# Patient Record
Sex: Male | Born: 1960 | Race: White | Hispanic: No | Marital: Married | Smoking: Former smoker
Health system: Southern US, Community
[De-identification: ages and names within clinical notes are randomized; demographics above are authoritative.]

## PROBLEM LIST (undated history)

## (undated) DIAGNOSIS — R569 Unspecified convulsions: Secondary | ICD-10-CM

## (undated) DIAGNOSIS — I4891 Unspecified atrial fibrillation: Secondary | ICD-10-CM

---

## 2006-12-11 ENCOUNTER — Ambulatory Visit: Payer: Self-pay | Admitting: Unknown Physician Specialty

## 2009-05-23 ENCOUNTER — Ambulatory Visit: Payer: Self-pay | Admitting: Family Medicine

## 2009-07-06 ENCOUNTER — Ambulatory Visit: Payer: Self-pay | Admitting: Specialist

## 2010-05-15 ENCOUNTER — Ambulatory Visit: Payer: Self-pay | Admitting: Specialist

## 2012-08-14 ENCOUNTER — Ambulatory Visit: Payer: Self-pay | Admitting: Neurology

## 2012-08-22 ENCOUNTER — Ambulatory Visit: Payer: Self-pay | Admitting: Neurology

## 2014-02-13 ENCOUNTER — Emergency Department: Payer: Self-pay | Admitting: Emergency Medicine

## 2015-12-30 ENCOUNTER — Encounter: Payer: Self-pay | Admitting: Emergency Medicine

## 2015-12-30 ENCOUNTER — Emergency Department
Admission: EM | Admit: 2015-12-30 | Discharge: 2015-12-30 | Disposition: A | Discharge status: Home / Self Care | Payer: Worker's Compensation | Attending: Emergency Medicine | Admitting: Emergency Medicine

## 2015-12-30 ENCOUNTER — Emergency Department: Payer: Worker's Compensation

## 2015-12-30 DIAGNOSIS — Z87891 Personal history of nicotine dependence: Secondary | ICD-10-CM | POA: Diagnosis not present

## 2015-12-30 DIAGNOSIS — S5002XA Contusion of left elbow, initial encounter: Secondary | ICD-10-CM | POA: Diagnosis not present

## 2015-12-30 DIAGNOSIS — S060X0A Concussion without loss of consciousness, initial encounter: Secondary | ICD-10-CM | POA: Insufficient documentation

## 2015-12-30 DIAGNOSIS — Y990 Civilian activity done for income or pay: Secondary | ICD-10-CM | POA: Insufficient documentation

## 2015-12-30 DIAGNOSIS — Y939 Activity, unspecified: Secondary | ICD-10-CM | POA: Diagnosis not present

## 2015-12-30 DIAGNOSIS — W19XXXA Unspecified fall, initial encounter: Secondary | ICD-10-CM

## 2015-12-30 DIAGNOSIS — Y929 Unspecified place or not applicable: Secondary | ICD-10-CM | POA: Diagnosis not present

## 2015-12-30 DIAGNOSIS — S8002XA Contusion of left knee, initial encounter: Secondary | ICD-10-CM | POA: Diagnosis not present

## 2015-12-30 DIAGNOSIS — S0083XA Contusion of other part of head, initial encounter: Secondary | ICD-10-CM | POA: Insufficient documentation

## 2015-12-30 DIAGNOSIS — W01198A Fall on same level from slipping, tripping and stumbling with subsequent striking against other object, initial encounter: Secondary | ICD-10-CM | POA: Insufficient documentation

## 2015-12-30 DIAGNOSIS — S0093XA Contusion of unspecified part of head, initial encounter: Secondary | ICD-10-CM

## 2015-12-30 DIAGNOSIS — S0990XA Unspecified injury of head, initial encounter: Secondary | ICD-10-CM | POA: Diagnosis present

## 2015-12-30 HISTORY — DX: Unspecified convulsions: R56.9

## 2015-12-30 MED ORDER — HYDROCODONE-ACETAMINOPHEN 5-325 MG PO TABS: 1.0000 | ORAL_TABLET | Freq: Four times a day (QID) | ORAL | 0 refills | Status: DC | PRN

## 2015-12-30 MED ORDER — IBUPROFEN 800 MG PO TABS
800.0000 mg | ORAL_TABLET | Freq: Once | ORAL | Status: AC
  Administered 2015-12-30: 800 mg via ORAL
  Filled 2015-12-30: qty 1

## 2015-12-30 MED ORDER — HYDROCODONE-ACETAMINOPHEN 5-325 MG PO TABS
1.0000 | ORAL_TABLET | ORAL | Status: AC
  Administered 2015-12-30: 1 via ORAL
  Filled 2015-12-30: qty 1

## 2015-12-30 NOTE — ED Provider Notes (Signed)
[facility] Provider Note   CSN: [id number] Arrival date & time: 12/30/15  2049     History   Chief Complaint Chief Complaint  Patient presents with  . Fall    HPI Edward SCALIA Sr. is a 55 y.o. male presents emergent department for evaluation of fall. Patient fell at work, slipped on water hit his head, left jaw, left arm along the elbow and left knee along the proximal fibula. Patient has been ambulatory. He was dazed after the fall. He has had a headache. No loss of consciousness nausea or vomiting. He denies any neck pain and lower back pain chest pain or shortness of breath. Pain is 7 out of 10 and mostly along the left parietal region of the head. He has not had any medications for pain. Wife states he is alert and oriented and with no signs of confusion. Patient denies any vision changes numbness tingling or radicular symptoms in the upper or lower extremities  HPI  Past Medical History:  Diagnosis Date  . Seizures ([place])    taken off meds in 2002    There are no active problems to display for this patient.   History reviewed. No pertinent surgical history.     Home Medications    Prior to Admission medications   Medication Sig Start Date End Date Taking? Authorizing Provider  HYDROcodone-acetaminophen (NORCO) 5-325 MG tablet Take 1 tablet by mouth every 6 (six) hours as needed for moderate pain. 12/30/15   Duanne Guess, PA-C    Family History No family history on file.  Social History Social History  Substance Use Topics  . Smoking status: Former Research scientist (life sciences)  . Smokeless tobacco: Current User  . Alcohol use No     Allergies   Review of patient's allergies indicates no known allergies.   Review of Systems Review of Systems  Constitutional: Negative.  Negative for activity change, appetite change, chills and fever.  HENT: Negative for congestion, ear pain, mouth sores, rhinorrhea, sinus pressure, sore throat and trouble swallowing.   Eyes:  Negative for photophobia, pain, discharge and visual disturbance.  Respiratory: Negative for cough, chest tightness and shortness of breath.   Cardiovascular: Negative for chest pain and leg swelling.  Gastrointestinal: Negative for abdominal distention, abdominal pain, diarrhea, nausea and vomiting.  Genitourinary: Negative for difficulty urinating and dysuria.  Musculoskeletal: Positive for arthralgias. Negative for back pain, gait problem and neck pain.  Skin: Negative for color change, rash and wound.  Neurological: Positive for headaches. Negative for dizziness, speech difficulty, weakness, light-headedness and numbness.  Hematological: Negative for adenopathy.  Psychiatric/Behavioral: Negative for agitation, behavioral problems, confusion and decreased concentration.     Physical Exam Updated Vital Signs BP (!) 144/79   Pulse 66   Temp 98.3 F (36.8 C) (Oral)   Resp 16   Ht 5\' 9"  (1.753 m)   Wt 81.6 kg   SpO2 100%   BMI 26.58 kg/m   Physical Exam  Constitutional: He is oriented to person, place, and time. He appears well-developed and well-nourished.  HENT:  Head: Normocephalic and atraumatic.  Right Ear: External ear normal.  Left Ear: External ear normal.  Nose: Nose normal.  Mouth/Throat: Oropharynx is clear and moist.  Atraumatic no hematomas or skin breakdown noted.  No battle signs.   Eyes: Conjunctivae and EOM are normal. Pupils are equal, round, and reactive to light.  Neck: Normal range of motion. Neck supple.  Cardiovascular: Normal rate and regular rhythm.   No murmur  heard. Pulmonary/Chest: Effort normal and breath sounds normal. No respiratory distress.  Abdominal: Soft. There is no tenderness.  Musculoskeletal: Normal range of motion.  Examination of the cervical thoracic lumbar spine shows patient has no spinous process tenderness. Patient has full range of motion of the shoulder or elbow wrist and digits. He is tender along the left elbow with painful  flexion. No swelling warmth erythema. No skin break down noted. Patient has full range of motion of the hip knee and ankle with no discomfort. He is tender along the proximal fibular head with mild swelling and ecchymosis. There is no skin breakdown noted. Knee is stable to valgus and varus stress testing. His neuro rest intact left lower extremity. He is able to ambulate with no assisted devices.  Neurological: He is alert and oriented to person, place, and time. He displays normal reflexes. No cranial nerve deficit. He exhibits normal muscle tone. Coordination normal.  Skin: Skin is warm and dry.  Psychiatric: He has a normal mood and affect.  Nursing note and vitals reviewed.    ED Treatments / Results  Labs (all labs ordered are listed, but only abnormal results are displayed) Labs Reviewed - No data to display  EKG  EKG Interpretation None       Radiology Dg Forearm Left  Result Date: 12/30/2015 CLINICAL DATA:  [place] at work.  Diffuse pain. EXAM: LEFT FOREARM - 2 VIEW COMPARISON:  None. FINDINGS: There is no evidence of fracture or other focal bone lesions. Soft tissues are unremarkable. IMPRESSION: Negative. Electronically Signed   By: Elon Alas M.D.   On: 12/30/2015 23:04   Dg Tibia/fibula Left  Result Date: 12/30/2015 CLINICAL DATA:  [place] at work, diffuse pain. EXAM: LEFT TIBIA AND FIBULA - 2 VIEW COMPARISON:  None. FINDINGS: There is no evidence of fracture or other focal bone lesions. Soft tissues are unremarkable. IMPRESSION: Negative. Electronically Signed   By: Elon Alas M.D.   On: 12/30/2015 23:05   Ct Head Wo Contrast  Result Date: 12/30/2015 CLINICAL DATA:  Status post fall.  Left-sided head pain. EXAM: CT HEAD WITHOUT CONTRAST CT MAXILLOFACIAL WITHOUT CONTRAST TECHNIQUE: Multidetector CT imaging of the head and maxillofacial structures were performed using the standard protocol without intravenous contrast. Multiplanar CT image reconstructions of the  maxillofacial structures were also generated. COMPARISON:  Brain MRI 08/22/2012 FINDINGS: CT HEAD FINDINGS There is no mass lesion, intraparenchymal hemorrhage or extra-axial collection. No evidence of acute cortical infarct. No hydrocephalus. Brain parenchyma and CSF-containing spaces are normal for age. The paranasal sinuses and mastoids are free of fluid. The orbits are normal. There is left frontoparietal soft tissue swelling without underlying skull fracture. CT MAXILLOFACIAL FINDINGS Complex facial fracture types: No LeFort, zygomaticomaxillary complex or nasoorbitoethmoidal fracture. Simple fracture types: None. Orbits: Globes appear intact. Normal intra- and extraconal fat. Paranasal sinuses and mastoids: Free of fluid. Calvarium and skull base No fracture identified. Extracranial soft tissues: Left frontoparietal soft tissue swelling. IMPRESSION: 1. No acute intracranial abnormality. 2. Left frontoparietal soft tissue swelling without underlying fracture. 3. No facial fracture. Electronically Signed   By: Ulyses Jarred M.D.   On: 12/30/2015 23:07   Ct Maxillofacial Wo Contrast  Result Date: 12/30/2015 CLINICAL DATA:  Status post fall.  Left-sided head pain. EXAM: CT HEAD WITHOUT CONTRAST CT MAXILLOFACIAL WITHOUT CONTRAST TECHNIQUE: Multidetector CT imaging of the head and maxillofacial structures were performed using the standard protocol without intravenous contrast. Multiplanar CT image reconstructions of the maxillofacial structures were also generated. COMPARISON:  Brain MRI 08/22/2012 FINDINGS: CT HEAD FINDINGS There is no mass lesion, intraparenchymal hemorrhage or extra-axial collection. No evidence of acute cortical infarct. No hydrocephalus. Brain parenchyma and CSF-containing spaces are normal for age. The paranasal sinuses and mastoids are free of fluid. The orbits are normal. There is left frontoparietal soft tissue swelling without underlying skull fracture. CT MAXILLOFACIAL FINDINGS  Complex facial fracture types: No LeFort, zygomaticomaxillary complex or nasoorbitoethmoidal fracture. Simple fracture types: None. Orbits: Globes appear intact. Normal intra- and extraconal fat. Paranasal sinuses and mastoids: Free of fluid. Calvarium and skull base No fracture identified. Extracranial soft tissues: Left frontoparietal soft tissue swelling. IMPRESSION: 1. No acute intracranial abnormality. 2. Left frontoparietal soft tissue swelling without underlying fracture. 3. No facial fracture. Electronically Signed   By: Ulyses Jarred M.D.   On: 12/30/2015 23:07    Procedures Procedures (including critical care time)  Medications Ordered in ED Medications  HYDROcodone-acetaminophen (NORCO/VICODIN) 5-325 MG per tablet 1 tablet (not administered)  ibuprofen (ADVIL,MOTRIN) tablet 800 mg (not administered)     Initial Impression / Assessment and Plan / ED Course  I have reviewed the triage vital signs and the nursing notes.  Pertinent labs & imaging results that were available during my care of the patient were reviewed by me and considered in my medical decision making (see chart for details).  Clinical Course    55 year old male with fall at work. Patient with mild concussion. No loss of consciousness. No nausea or vomiting. CT head and maxillofacial negative. X-rays of the left forearm and left lower leg are normal with no evidence of acute bony abnormality. He is given a prescription for Norco 5-3 25 for pain. He'll avoid driving for 24 hours. He is educated on signs and symptoms return to the ED for. He'll be monitored by his wife. He'll follow-up with PCP in 2-3 days for recheck.  Final Clinical Impressions(s) / ED Diagnoses   Final diagnoses:  Fall, initial encounter  Facial contusion, initial encounter  Head contusion, initial encounter  Knee contusion, left, initial encounter  Elbow contusion, left, initial encounter  Concussion, without loss of consciousness, initial  encounter    New Prescriptions New Prescriptions   HYDROCODONE-ACETAMINOPHEN (NORCO) 5-325 MG TABLET    Take 1 tablet by mouth every 6 (six) hours as needed for moderate pain.     Duanne Guess, PA-C 12/30/15 [id number]    Harvest Dark, MD 01/02/16 [id number]

## 2015-12-30 NOTE — Discharge Instructions (Signed)
Please ice extremities. Take medication as needed for pain. Avoid driving for 24 hours. Avoid watching TV, looking at phones, tablets until headache has improved. Please follow-up with PCP in 2-3 days for recheck. Return to the ER for any worsening headache, vomiting, or for any urgent changes in her health.

## 2015-12-30 NOTE — ED Notes (Signed)
Pt with pain to left elbow laterally, left lateral knee, left lateral forehead above eyebrow and to temporal area. perrl 74mm and brisk. Cms intact in all extremities. Pt denies loc, vomiting. Pt with redness and swelling noted to left temporal area. Pa gaines and triplett notified of possible need for head ct and to evaluate pt. Pt without drainage from ears or nose.

## 2015-12-30 NOTE — ED Notes (Signed)
I introduced myself to the pt and informed him based on his job profile we [facility]) would be filing a WC claim on him. Pt agree 'd to the UDS and verbalized that he was unable to urinate at this time. Therefore I have halted the paper work process and will initiate it when he is able to urinate since he is unable to drink due to the various scans/tests.

## 2015-12-30 NOTE — ED Notes (Signed)
Patient transported to CT 

## 2015-12-30 NOTE — ED Triage Notes (Addendum)
Slipped in some water and fell, hit head on concrete floor.  Denies LOC.  C/O pain to left head, left knee and left forearm and wrist pain.  Moving all extremities equally and strong.  Skin warm and dry. NAD  Patient was a work at [employer].  WC.

## 2015-12-30 NOTE — ED Notes (Signed)
[employer] not listed on WC list.

## 2016-01-24 ENCOUNTER — Encounter
Admission: RE | Disposition: A | Discharge status: Home / Self Care | Payer: Self-pay | Source: Ambulatory Visit | Attending: Gastroenterology

## 2016-01-24 ENCOUNTER — Ambulatory Visit
Admission: RE | Admit: 2016-01-24 | Discharge: 2016-01-24 | Disposition: A | Discharge status: Home / Self Care | Payer: Managed Care, Other (non HMO) | Source: Ambulatory Visit | Attending: Gastroenterology | Admitting: Gastroenterology

## 2016-01-24 ENCOUNTER — Ambulatory Visit: Payer: Managed Care, Other (non HMO) | Admitting: Anesthesiology

## 2016-01-24 DIAGNOSIS — Z1211 Encounter for screening for malignant neoplasm of colon: Secondary | ICD-10-CM | POA: Diagnosis present

## 2016-01-24 DIAGNOSIS — D124 Benign neoplasm of descending colon: Secondary | ICD-10-CM | POA: Diagnosis not present

## 2016-01-24 DIAGNOSIS — K5730 Diverticulosis of large intestine without perforation or abscess without bleeding: Secondary | ICD-10-CM | POA: Insufficient documentation

## 2016-01-24 DIAGNOSIS — Z8669 Personal history of other diseases of the nervous system and sense organs: Secondary | ICD-10-CM | POA: Diagnosis not present

## 2016-01-24 DIAGNOSIS — D1779 Benign lipomatous neoplasm of other sites: Secondary | ICD-10-CM | POA: Diagnosis not present

## 2016-01-24 DIAGNOSIS — Z87891 Personal history of nicotine dependence: Secondary | ICD-10-CM | POA: Diagnosis not present

## 2016-01-24 HISTORY — PX: COLONOSCOPY WITH PROPOFOL: SHX5780

## 2016-01-24 SURGERY — COLONOSCOPY WITH PROPOFOL
Anesthesia: General

## 2016-01-24 MED ORDER — SODIUM CHLORIDE 0.9 % IV SOLN
INTRAVENOUS | Status: DC
  Administered 2016-01-24 (×2): via INTRAVENOUS

## 2016-01-24 MED ORDER — PROPOFOL 500 MG/50ML IV EMUL
INTRAVENOUS | Status: DC | PRN
  Administered 2016-01-24: 100 ug/kg/min via INTRAVENOUS

## 2016-01-24 MED ORDER — SODIUM CHLORIDE 0.9 % IV SOLN
INTRAVENOUS | Status: DC
  Administered 2016-01-24: 1000 mL via INTRAVENOUS

## 2016-01-24 NOTE — Anesthesia Preprocedure Evaluation (Signed)
Anesthesia Evaluation  Patient identified by MRN, date of birth, ID band Patient awake    Reviewed: Allergy & Precautions, NPO status , Patient's Chart, lab work & pertinent test results  History of Anesthesia Complications Negative for: history of anesthetic complications  Airway Mallampati: II       Dental   Pulmonary neg pulmonary ROS, former smoker,           Cardiovascular negative cardio ROS       Neuro/Psych Seizures - (none in 20 yers, no meds x 15 yrs),     GI/Hepatic negative GI ROS, Neg liver ROS,   Endo/Other  negative endocrine ROS  Renal/GU negative Renal ROS     Musculoskeletal   Abdominal   Peds  Hematology negative hematology ROS (+)   Anesthesia Other Findings   Reproductive/Obstetrics                             Anesthesia Physical Anesthesia Plan  ASA: II  Anesthesia Plan: General   Post-op Pain Management:    Induction: Intravenous  Airway Management Planned: Nasal Cannula  Additional Equipment:   Intra-op Plan:   Post-operative Plan:   Informed Consent: I have reviewed the patients History and Physical, chart, labs and discussed the procedure including the risks, benefits and alternatives for the proposed anesthesia with the patient or authorized representative who has indicated his/her understanding and acceptance.     Plan Discussed with:   Anesthesia Plan Comments:         Anesthesia Quick Evaluation

## 2016-01-24 NOTE — Transfer of Care (Signed)
Immediate Anesthesia Transfer of Care Note  Patient: Edward Lion Sr.  Procedure(s) Performed: Procedure(s): COLONOSCOPY WITH PROPOFOL (N/A)  Patient Location: PACU  Anesthesia Type:General  Level of Consciousness: awake, alert  and oriented  Airway & Oxygen Therapy: Patient Spontanous Breathing and Patient connected to nasal cannula oxygen  Post-op Assessment: Report given to RN and Post -op Vital signs reviewed and stable  Post vital signs: Reviewed and stable  Last Vitals:  Vitals:   01/24/16 1320 01/24/16 1509  BP: 134/66   Pulse: 71   Resp: 20   Temp: 36.7 C (!) (P) 36 C    Last Pain:  Vitals:   01/24/16 1509  TempSrc: (P) Tympanic         Complications: No apparent anesthesia complications

## 2016-01-24 NOTE — Op Note (Addendum)
[facility] Gastroenterology Patient Name: Edward Juarez Procedure Date: 01/24/2016 2:09 PM MRN: [id number] Account #: [id number] Date of Birth: [birth year] Admit Type: Outpatient Age: 55 Room: [facility] ENDO ROOM 3 Gender: Male Note Status: Finalized Procedure:            Colonoscopy Indications:          Screening for colorectal malignant neoplasm, This is                        the patient's first colonoscopy Providers:            Lollie Sails, MD Referring MD:         Precious Bard, MD (Referring MD) Medicines:            Monitored Anesthesia Care Complications:        No immediate complications. Procedure:            Pre-Anesthesia Assessment:                       - ASA Grade Assessment: II - A patient with mild                        systemic disease.                       After obtaining informed consent, the colonoscope was                        passed under direct vision. Throughout the procedure,                        the patient's blood pressure, pulse, and oxygen                        saturations were monitored continuously. The                        Colonoscope was introduced through the anus and                        advanced to the the cecum, identified by appendiceal                        orifice and ileocecal valve. The colonoscopy was                        performed without difficulty. The patient tolerated the                        procedure well. The quality of the bowel preparation                        was good. Findings:      Multiple small-mouthed diverticula were found in the sigmoid colon and       descending colon.      A 6 mm polyp was found in the distal descending colon. The polyp was       flat. The polyp was removed with a cold snare. Resection and retrieval       were complete.      A 14 mm polyp was found in the  proximal descending colon. The polyp was       flat. The polyp was removed with a cold biopsy forceps  and The polyp was       removed with a lift and cut technique using a hot snare. Resection and       retrieval were complete using a suction (via the working channel). Three       hemostatic clips were successfully placed (MR conditional).      The exam was otherwise normal throughout the examined colon.      The retroflexed view of the distal rectum and anal verge was normal and       showed no anal or rectal abnormalities.      The digital rectal exam was normal.      There was a small lipoma, 12 mm in diameter, in the mid ascending colon,       note positive pillow sign. Impression:           - Diverticulosis in the sigmoid colon and in the                        descending colon.                       - One 6 mm polyp in the distal descending colon,                        removed with a cold snare. Resected and retrieved.                       - One 14 mm polyp in the proximal descending colon,                        removed with a cold biopsy forceps and removed using                        lift and cut and a hot snare. Resected and retrieved.                        Clips (MR conditional) were placed.                       - The distal rectum and anal verge are normal on                        retroflexion view. Recommendation:       - Discharge patient to home.                       - Telephone GI clinic for pathology results in 1 week. Procedure Code(s):    --- Professional ---                       [phone], Colonoscopy, flexible; with removal of tumor(s),                        polyp(s), or other lesion(s) by snare technique Diagnosis Code(s):    --- Professional ---                       Z12.11, Encounter for screening for malignant neoplasm  of colon                       D12.4, Benign neoplasm of descending colon                       K57.30, Diverticulosis of large intestine without                        perforation or abscess without bleeding CPT  copyright 2016 American Medical Association. All rights reserved. The codes documented in this report are preliminary and upon coder review may  be revised to meet current compliance requirements. Lollie Sails, MD 01/24/2016 3:11:17 PM This report has been signed electronically. Number of Addenda: 0 Note Initiated On: 01/24/2016 2:09 PM Scope Withdrawal Time: 0 hours 30 minutes 10 seconds  Total Procedure Duration: 0 hours 41 minutes 37 seconds       [facility]

## 2016-01-24 NOTE — OR Nursing (Signed)
Three clips applied to hot snared proximal decending colon polyp

## 2016-01-24 NOTE — H&P (Signed)
Outpatient short stay form Pre-procedure 01/24/2016 2:13 PM Lollie Sails MD  Primary Physician: Jackelyn Poling PA  Reason for visit:  Screening colonoscopy  History of present illness:  Patient is a 55 year old male presenting today as above. He tolerated his prep well. He takes no aspirin products or blood thinning agents. This is his first colonoscopy.    Current Facility-Administered Medications:  .  0.9 %  sodium chloride infusion, , Intravenous, Continuous, Lollie Sails, MD, Last Rate: 20 mL/hr at 01/24/16 1320 .  0.9 %  sodium chloride infusion, , Intravenous, Continuous, Lollie Sails, MD  Prescriptions Prior to Admission  Medication Sig Dispense Refill Last Dose  . HYDROcodone-acetaminophen (NORCO) 5-325 MG tablet Take 1 tablet by mouth every 6 (six) hours as needed for moderate pain. (Patient not taking: Reported on 01/23/2016) 15 tablet 0 Completed Course at Unknown time     No Known Allergies   Past Medical History:  Diagnosis Date  . Seizures ([place])    taken off meds in 2002    Review of systems:      Physical Exam    Heart and lungs: Regular rate and rhythm without rub or gallop, lungs are bilaterally clear.    HEENT: Normocephalic atraumatic eyes are anicteric    Other:     Pertinant exam for procedure: Soft nontender nondistended bowel sounds positive normoactive.    Planned proceedures: Colonoscopy and indicated procedures. I have discussed the risks benefits and complications of procedures to include not limited to bleeding, infection, perforation and the risk of sedation and the patient wishes to proceed.    Lollie Sails, MD Gastroenterology 01/24/2016  2:13 PM

## 2016-01-25 ENCOUNTER — Encounter: Payer: Self-pay | Admitting: Gastroenterology

## 2016-01-25 NOTE — Anesthesia Postprocedure Evaluation (Signed)
Anesthesia Post Note  Patient: DARRELL EMRICH Sr.  Procedure(s) Performed: Procedure(s) (LRB): COLONOSCOPY WITH PROPOFOL (N/A)  Patient location during evaluation: Endoscopy Anesthesia Type: General Level of consciousness: awake and alert Pain management: pain level controlled Vital Signs Assessment: post-procedure vital signs reviewed and stable Respiratory status: spontaneous breathing, nonlabored ventilation, respiratory function stable and patient connected to nasal cannula oxygen Cardiovascular status: blood pressure returned to baseline and stable Postop Assessment: no signs of nausea or vomiting Anesthetic complications: no    Last Vitals:  Vitals:   01/24/16 1529 01/24/16 1539  BP: 129/80 122/75  Pulse: 68 (!) 58  Resp: 19 14  Temp:      Last Pain:  Vitals:   01/24/16 1509  TempSrc: Tympanic                 Martha Clan

## 2016-01-26 LAB — SURGICAL PATHOLOGY

## 2017-04-29 DIAGNOSIS — Z719 Counseling, unspecified: Secondary | ICD-10-CM | POA: Diagnosis not present

## 2017-05-23 DIAGNOSIS — J039 Acute tonsillitis, unspecified: Secondary | ICD-10-CM | POA: Diagnosis not present

## 2017-08-22 IMAGING — CT CT HEAD W/O CM
3 of 6 series · 15 of 47 positions shown, 18 images · non-contrast
Comparison: Brain MRI 08/22/2012

CLINICAL DATA: Status post fall.  Left-sided head pain.

EXAM:
CT HEAD WITHOUT CONTRAST
CT MAXILLOFACIAL WITHOUT CONTRAST
TECHNIQUE: Multidetector CT imaging of the head and maxillofacial structures
were performed using the standard protocol without intravenous
contrast. Multiplanar CT image reconstructions of the maxillofacial
structures were also generated.

[Series 4: coronal soft tissue · coronal · 0.30mm/px · 3 of 66 slices shown]
[im 17/66  brain]
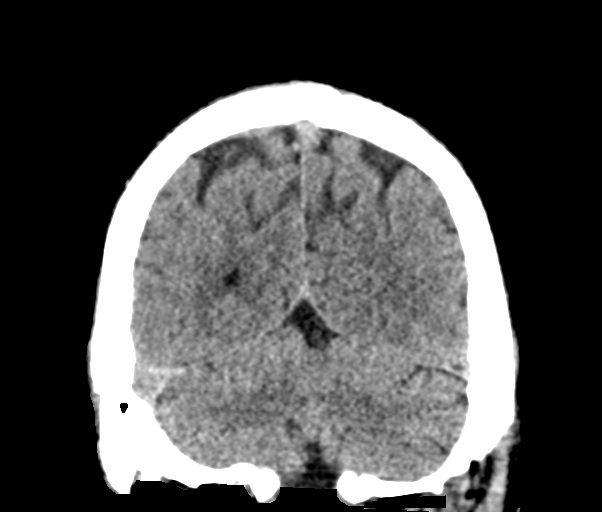
[im 33/66  brain]
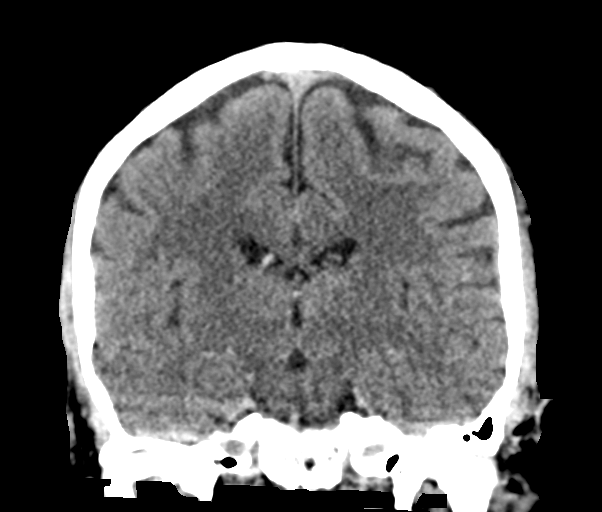
[im 49/66  brain]
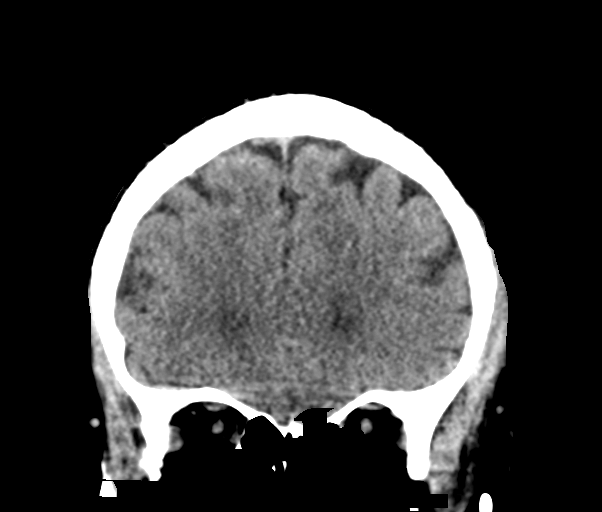

[Series 6: max soft · axial · 0.37mm/px · z∈[-186,-26]mm · 10 of 98 slices shown, 13 images]
[im 9/98  brain]
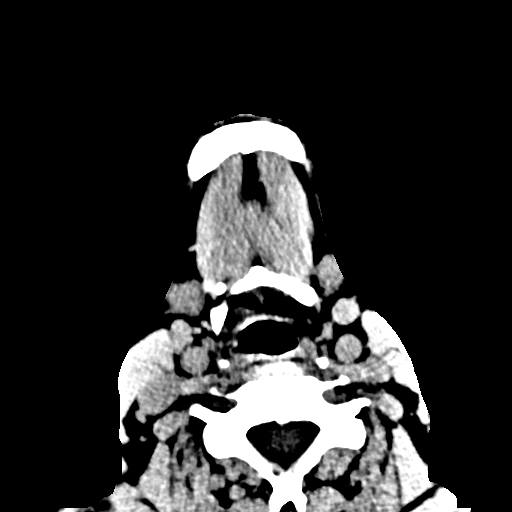
[im 9/98  bone]
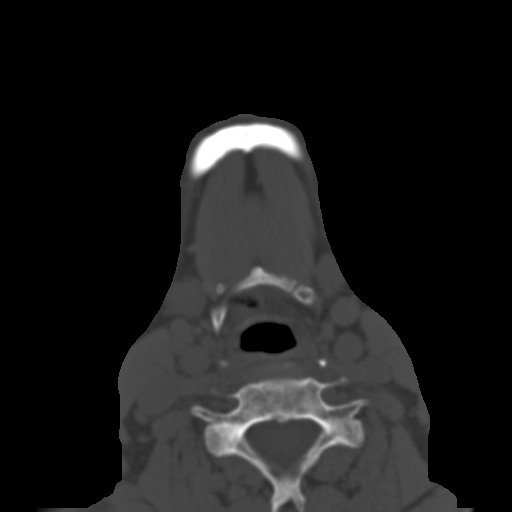
[im 18/98  brain]
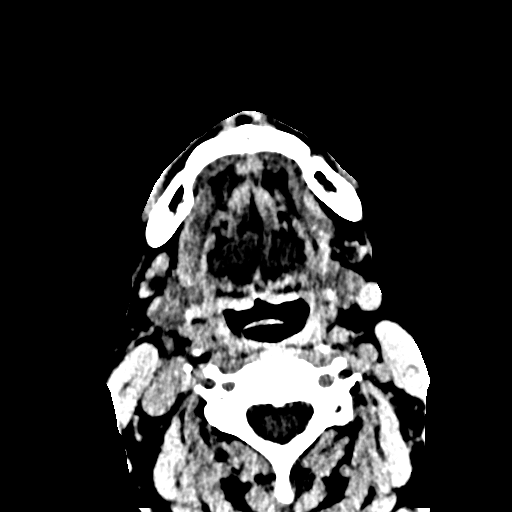
[im 27/98  brain]
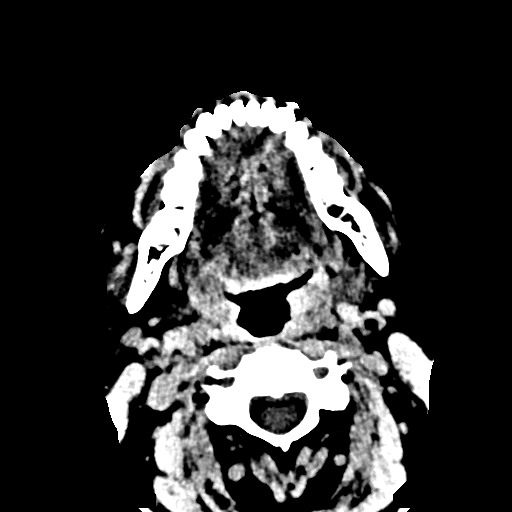
[im 36/98  brain]
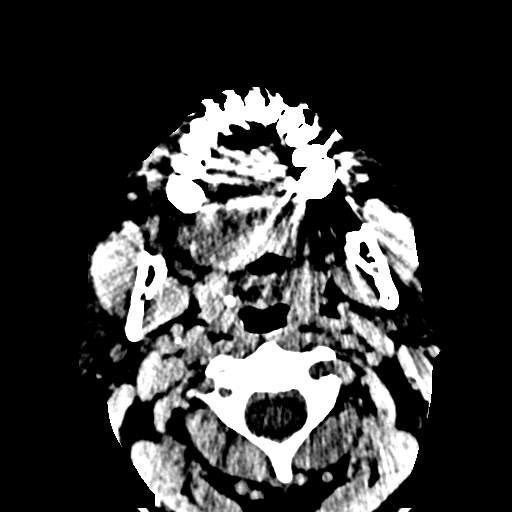
[im 45/98  brain]
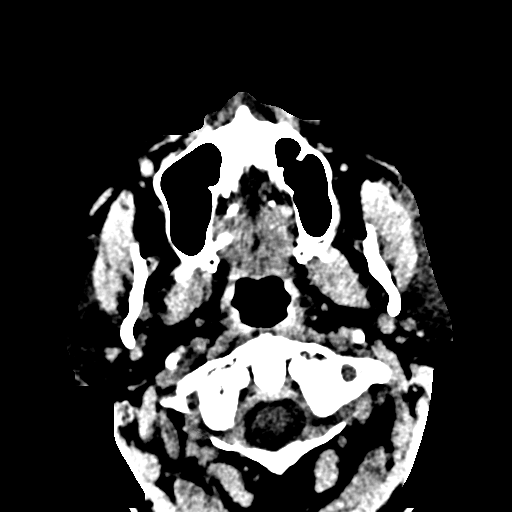
[im 45/98  bone]
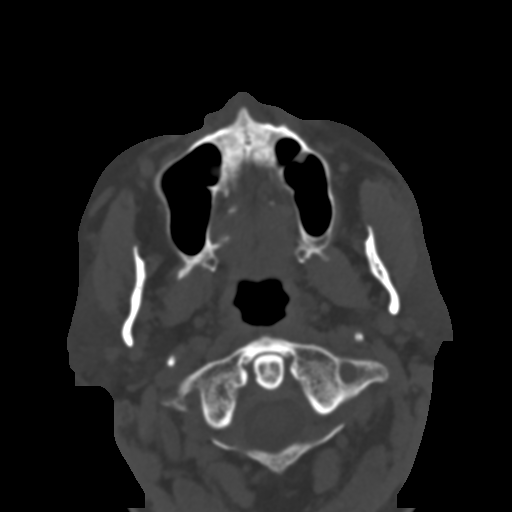
[im 53/98  brain]
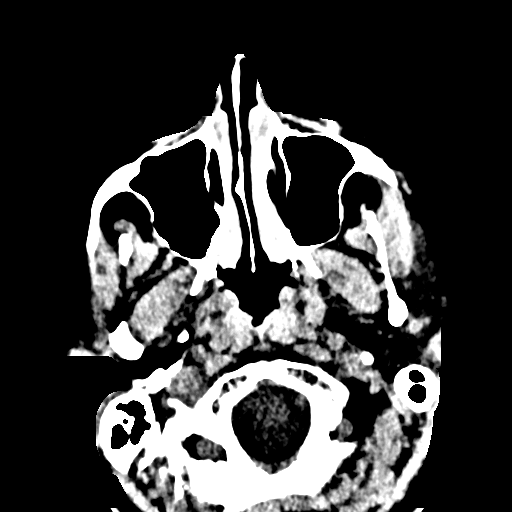
[im 62/98  brain]
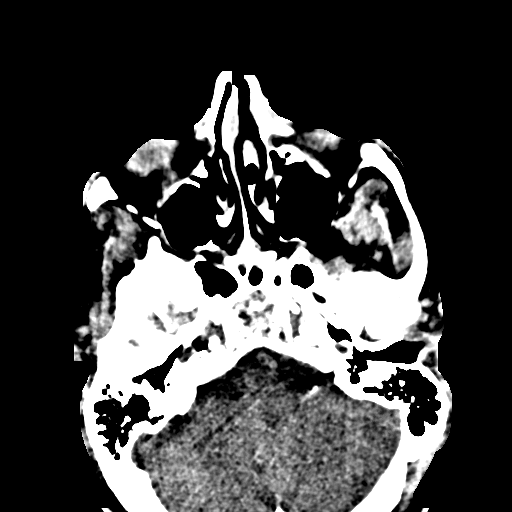
[im 71/98  brain]
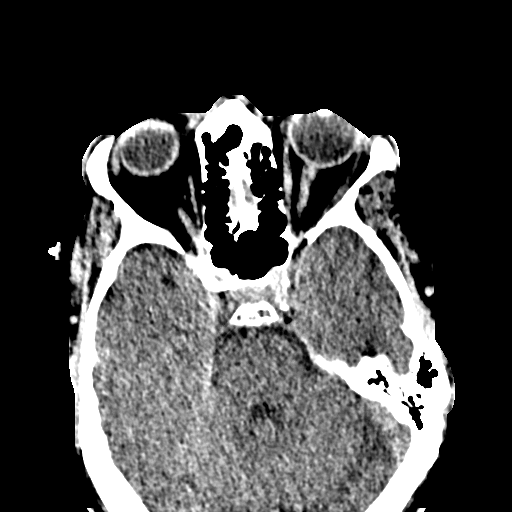
[im 80/98  brain]
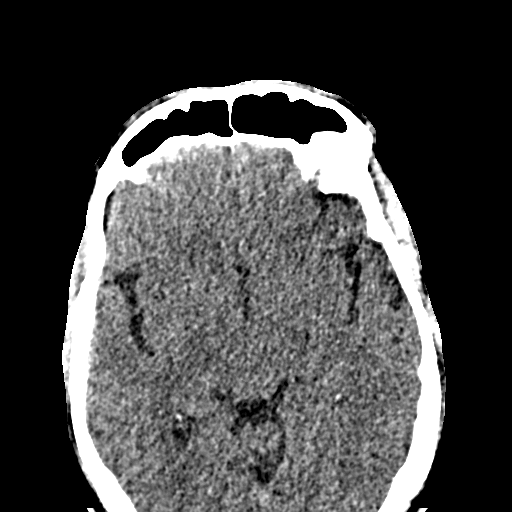
[im 80/98  bone]
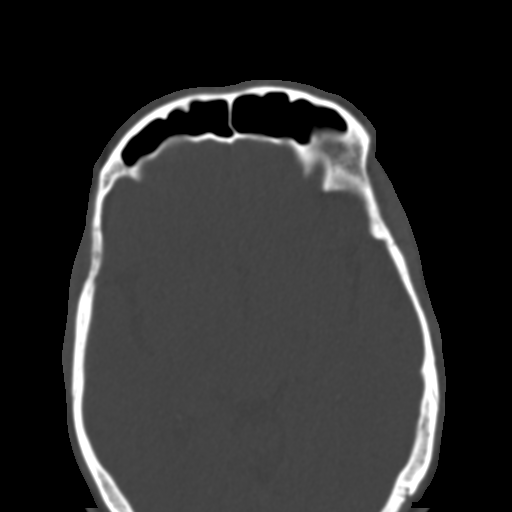
[im 89/98  brain]
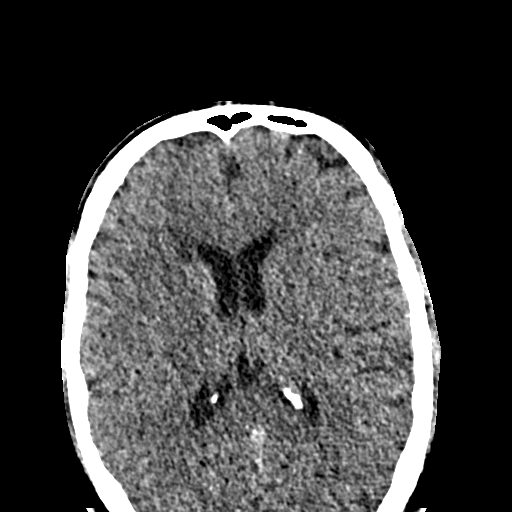

[Series 11: sagittal soft · sagittal · 0.38mm/px · 2 of 77 slices shown]
[im 26/77  brain]
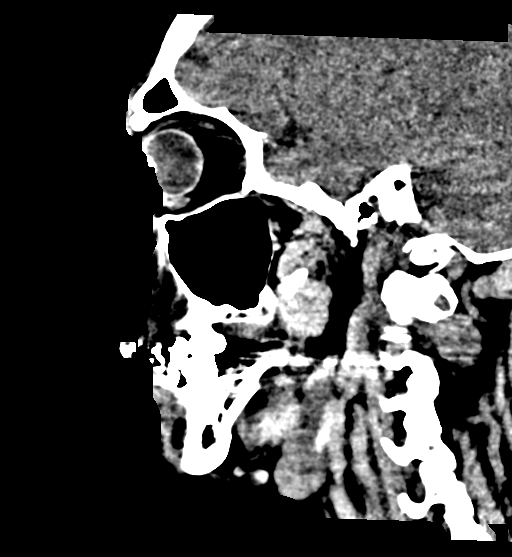
[im 51/77  brain]
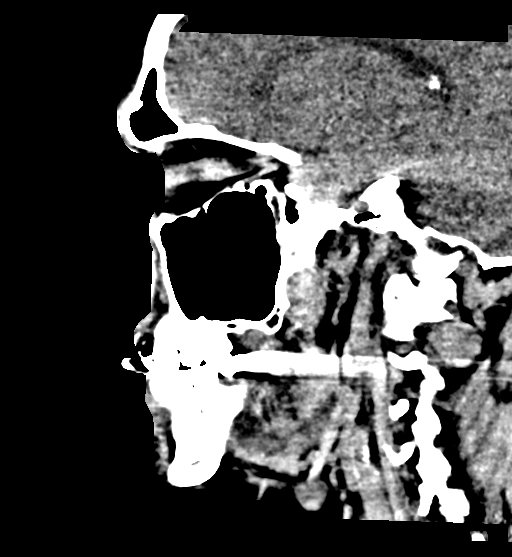

[15 of 47 positions shown; findings below may reference images not displayed]

FINDINGS: CT HEAD FINDINGS

There is no mass lesion, intraparenchymal hemorrhage or extra-axial
collection. No evidence of acute cortical infarct. No hydrocephalus.
Brain parenchyma and CSF-containing spaces are normal for age.

The paranasal sinuses and mastoids are free of fluid. The orbits are
normal. There is left frontoparietal soft tissue swelling without
underlying skull fracture.

CT MAXILLOFACIAL FINDINGS

Complex facial fracture types: No LeFort, zygomaticomaxillary
complex or nasoorbitoethmoidal fracture.

Simple fracture types: None.

Orbits: Globes appear intact. Normal intra- and extraconal fat.

Paranasal sinuses and mastoids: Free of fluid.

Calvarium and skull base No fracture identified.

Extracranial soft tissues: Left frontoparietal soft tissue swelling.
IMPRESSION: 1. No acute intracranial abnormality.
2. Left frontoparietal soft tissue swelling without underlying
fracture.
3. No facial fracture.

## 2017-11-06 ENCOUNTER — Other Ambulatory Visit: Payer: Self-pay | Admitting: Physician Assistant

## 2017-11-06 DIAGNOSIS — R1032 Left lower quadrant pain: Secondary | ICD-10-CM

## 2017-11-15 ENCOUNTER — Ambulatory Visit
Admission: RE | Admit: 2017-11-15 | Discharge: 2017-11-15 | Disposition: A | Discharge status: Home / Self Care | Payer: Commercial Managed Care - HMO | Source: Ambulatory Visit | Attending: Physician Assistant | Admitting: Physician Assistant

## 2017-11-15 DIAGNOSIS — R319 Hematuria, unspecified: Secondary | ICD-10-CM | POA: Insufficient documentation

## 2017-11-15 DIAGNOSIS — R1032 Left lower quadrant pain: Secondary | ICD-10-CM | POA: Insufficient documentation

## 2017-11-15 DIAGNOSIS — M5137 Other intervertebral disc degeneration, lumbosacral region: Secondary | ICD-10-CM | POA: Diagnosis not present

## 2017-11-15 DIAGNOSIS — I700 Atherosclerosis of aorta: Secondary | ICD-10-CM | POA: Diagnosis not present

## 2017-11-15 DIAGNOSIS — M4317 Spondylolisthesis, lumbosacral region: Secondary | ICD-10-CM | POA: Diagnosis not present

## 2017-11-15 DIAGNOSIS — M8448XA Pathological fracture, other site, initial encounter for fracture: Secondary | ICD-10-CM | POA: Diagnosis not present

## 2020-07-22 ENCOUNTER — Emergency Department
Admission: EM | Admit: 2020-07-22 | Discharge: 2020-07-22 | Disposition: A | Discharge status: Home / Self Care | Payer: BC Managed Care – PPO | Attending: Emergency Medicine | Admitting: Emergency Medicine

## 2020-07-22 ENCOUNTER — Emergency Department: Payer: BC Managed Care – PPO

## 2020-07-22 ENCOUNTER — Other Ambulatory Visit: Payer: Self-pay

## 2020-07-22 DIAGNOSIS — R000 Tachycardia, unspecified: Secondary | ICD-10-CM | POA: Diagnosis present

## 2020-07-22 DIAGNOSIS — I4891 Unspecified atrial fibrillation: Secondary | ICD-10-CM | POA: Insufficient documentation

## 2020-07-22 DIAGNOSIS — Z87891 Personal history of nicotine dependence: Secondary | ICD-10-CM | POA: Insufficient documentation

## 2020-07-22 LAB — MAGNESIUM: Magnesium: 2.2 mg/dL (ref 1.7–2.4)

## 2020-07-22 LAB — T4, FREE: Free T4: 0.93 ng/dL (ref 0.61–1.12)

## 2020-07-22 LAB — CBC
HCT: 48.2 % (ref 39.0–52.0)
Hemoglobin: 16.1 g/dL (ref 13.0–17.0)
MCH: 30.1 pg (ref 26.0–34.0)
MCHC: 33.4 g/dL (ref 30.0–36.0)
MCV: 90.3 fL (ref 80.0–100.0)
Platelets: 266 10*3/uL (ref 150–400)
RBC: 5.34 MIL/uL (ref 4.22–5.81)
RDW: 12.6 % (ref 11.5–15.5)
WBC: 10.7 10*3/uL — ABNORMAL HIGH (ref 4.0–10.5)
nRBC: 0 % (ref 0.0–0.2)

## 2020-07-22 LAB — BASIC METABOLIC PANEL
Anion gap: 8 (ref 5–15)
BUN: 19 mg/dL (ref 6–20)
CO2: 22 mmol/L (ref 22–32)
Calcium: 9 mg/dL (ref 8.9–10.3)
Chloride: 105 mmol/L (ref 98–111)
Creatinine, Ser: 1.16 mg/dL (ref 0.61–1.24)
GFR, Estimated: >60 mL/min (ref >60)
Glucose, Bld: 161 mg/dL — ABNORMAL HIGH (ref 70–99)
Potassium: 4 mmol/L (ref 3.5–5.1)
Sodium: 135 mmol/L (ref 135–145)

## 2020-07-22 LAB — TROPONIN I (HIGH SENSITIVITY)
Troponin I (High Sensitivity): 4 ng/L (ref <18)
Troponin I (High Sensitivity): 4 ng/L (ref <18)

## 2020-07-22 LAB — TSH: TSH: 1.673 u[IU]/mL (ref 0.350–4.500)

## 2020-07-22 MED ORDER — METOPROLOL TARTRATE 25 MG PO TABS: 12.5000 mg | ORAL_TABLET | Freq: Two times a day (BID) | ORAL | 0 refills | Status: AC

## 2020-07-22 MED ORDER — METOPROLOL TARTRATE 25 MG PO TABS
12.5000 mg | ORAL_TABLET | Freq: Once | ORAL | Status: AC
  Administered 2020-07-22: 12.5 mg via ORAL
  Filled 2020-07-22: qty 1

## 2020-07-22 NOTE — ED Triage Notes (Signed)
Pt states his apple watch has been alarming tachycardia and a fib the past few days. No hx of same. States he has felt a hot flash in chest intermittently. A&O, in wheelchair, speech clear.

## 2020-07-22 NOTE — ED Notes (Signed)
Report to [facility], Therapist, sports.

## 2020-07-22 NOTE — Discharge Instructions (Addendum)
Your work-up shows new atrial fibrillation.  Call the cardiology number and see you need the ER follow-up.  We started you on metoprolol.  We will start by taking half a pill twice a day.  Take one baby aspirin daily, this is 81 mg.  Return to the ER for prolonged elevated heart rates or having significant symptoms

## 2020-07-22 NOTE — ED Notes (Signed)
Patient reports getting alerts on his smart watch that his heart rate was fast. The patient denies chest pain, but reports "hot flashes" a few times a week for 2 weeks. Patient reports several HR alerts on his phone over the last few days, with the highest rate being 169. Patient denies hx of arrhythmias, or cardiac problems.

## 2020-07-22 NOTE — ED Provider Notes (Signed)
[facility] Emergency Department Provider Note  ____________________________________________   Event Date/Time   First MD Initiated Contact with Patient 07/22/20 1743     (approximate)  I have reviewed the triage vital signs and the nursing notes.   HISTORY  Chief Complaint Tachycardia    HPI Edward MALINOSKI Sr. is a 60 y.o. male who is otherwise healthy who comes in with atrial fibrillation.  Patient reports he has an apple watch who over the past 1 week has been reading a high heart rate.  He denies any chest pain or shortness of breath.  Denies any leg swelling.  Denies history of PE.  Patient reports sometimes he feels a little hot.  His heart rates have been highest at 169, nothing makes it better, nothing makes them worse.  Denies any history of cardiac problems.            Past Medical History:  Diagnosis Date  . Seizures ([place])    taken off meds in 2002    There are no problems to display for this patient.   Past Surgical History:  Procedure Laterality Date  . COLONOSCOPY WITH PROPOFOL N/A 01/24/2016   Procedure: COLONOSCOPY WITH PROPOFOL;  Surgeon: Edward Sails, MD;  Location: [facility] ENDOSCOPY;  Service: Endoscopy;  Laterality: N/A;    Prior to Admission medications   Medication Sig Start Date End Date Taking? Authorizing Provider  HYDROcodone-acetaminophen (NORCO) 5-325 MG tablet Take 1 tablet by mouth every 6 (six) hours as needed for moderate pain. Patient not taking: Reported on 01/23/2016 12/30/15   Edward Guess, PA-C    Allergies Patient has no known allergies.  History reviewed. No pertinent family history.  Social History Social History   Tobacco Use  . Smoking status: Former Research scientist (life sciences)  . Smokeless tobacco: Current User  . Tobacco comment: vaporizer used this am  Vaping Use  . Vaping Use: Every day  Substance Use Topics  . Alcohol use: Yes    Comment: 3weeks liquor  . Drug use: No      Review of  Systems Constitutional: No fever/chills Eyes: No visual changes. ENT: No sore throat. Cardiovascular: elevated heart rate Respiratory: Denies shortness of breath. Gastrointestinal: No abdominal pain.  No nausea, no vomiting.  No diarrhea.  No constipation. Genitourinary: Negative for dysuria. Musculoskeletal: Negative for back pain. Skin: Negative for rash. Neurological: Negative for headaches, focal weakness or numbness. All other ROS negative ____________________________________________   PHYSICAL EXAM:  VITAL SIGNS: ED Triage Vitals  Enc Vitals Group     BP 07/22/20 1657 (!) 162/95     Pulse Rate 07/22/20 1657 96     Resp 07/22/20 1657 18     Temp 07/22/20 1657 98.3 F (36.8 C)     Temp Source 07/22/20 1657 Oral     SpO2 07/22/20 1657 99 %     Weight 07/22/20 1658 185 lb (83.9 kg)     Height 07/22/20 1658 5\' 9"  (1.753 m)     Head Circumference --      Peak Flow --      Pain Score 07/22/20 1658 0     Pain Loc --      Pain Edu? --      Excl. in [place]? --     Constitutional: Alert and oriented. Well appearing and in no acute distress. Eyes: Conjunctivae are normal. EOMI. Head: Atraumatic. Nose: No congestion/rhinnorhea. Mouth/Throat: Mucous membranes are moist.   Neck: No stridor. Trachea Midline. FROM Cardiovascular: Normal  rate, irregular. Grossly normal heart sounds.  Good peripheral circulation. Respiratory: Normal respiratory effort.  No retractions. Lungs CTAB. Gastrointestinal: Soft and nontender. No distention. No abdominal bruits.  Musculoskeletal: No lower extremity tenderness nor edema.  No joint effusions. Neurologic:  Normal speech and language. No gross focal neurologic deficits are appreciated.  Skin:  Skin is warm, dry and intact. No rash noted. Psychiatric: Mood and affect are normal. Speech and behavior are normal. GU: Deferred   ____________________________________________   LABS (all labs ordered are listed, but only abnormal results are  displayed)  Labs Reviewed  BASIC METABOLIC PANEL - Abnormal; Notable for the following components:      Result Value   Glucose, Bld 161 (*)    All other components within normal limits  CBC - Abnormal; Notable for the following components:   WBC 10.7 (*)    All other components within normal limits  MAGNESIUM  TSH  T4, FREE  TROPONIN I (HIGH SENSITIVITY)  TROPONIN I (HIGH SENSITIVITY)   ____________________________________________   ED ECG REPORT I, Edward Juarez, the attending physician, personally viewed and interpreted this ECG.  Atrial fibrillation rate of 133, no ST elevation, no T wave inversions except for aVL, normal intervals ____________________________________________  RADIOLOGY Edward Juarez, personally viewed and evaluated these images (plain radiographs) as part of my medical decision making, as well as reviewing the written report by the radiologist.  ED MD interpretation: No effusions  Official radiology report(s): DG Chest 2 View  Result Date: 07/22/2020 CLINICAL DATA:  Atrial fibrillation with RVR. EXAM: CHEST - 2 VIEW COMPARISON:  None. FINDINGS: The cardiomediastinal contours are normal. Mild bronchial thickening. Pulmonary vasculature is normal. No consolidation, pleural effusion, or pneumothorax. No acute osseous abnormalities are seen. IMPRESSION: Mild bronchial thickening. Electronically Signed   By: Edward Juarez M.D.   On: 07/22/2020 18:05    ____________________________________________   PROCEDURES  Procedure(s) performed (including Critical Care):  .1-3 Lead EKG Interpretation Performed by: Edward Lincoln, MD Authorized by: Edward [place], MD     Interpretation: abnormal     ECG rate:  80-90s    ECG rate assessment: normal     Rhythm: atrial fibrillation     Ectopy: none     Conduction: normal       ____________________________________________   INITIAL IMPRESSION / ASSESSMENT AND PLAN / ED COURSE  Edward VOGELSANG Sr. was  evaluated in Emergency Department on 07/22/2020 for the symptoms described in the history of present illness. He was evaluated in the context of the global COVID-19 pandemic, which necessitated consideration that the patient might be at risk for infection with the SARS-CoV-2 virus that causes COVID-19. Institutional protocols and algorithms that pertain to the evaluation of patients at risk for COVID-19 are in a state of rapid change based on information released by regulatory bodies including the CDC and federal and state organizations. These policies and algorithms were followed during the patient's care in the ED.    Patient is a 60 year old with new onset atrial fibrillation.  Patient is very well-appearing initially his EKG did show RVR but when I went to evaluate him in the room he was with rates in the 80s to 90s.  I kept him on the cardiac monitor and throughout his work-up he never went above 100.  Labs were ordered to evaluate for Electra abnormalities, AKI, thyroid dysfunction.  Denies any shortness of breath and I have low suspicion for PE.  There is no evidence of  pleural effusions or leg swelling to suggest heart failure from his A. Fib.    patient was given 12.5 metoprolol.  His [place] Vasc is 0 so do not need to initiate blood thinner encourage patient take baby aspirin daily.  At this time patient felt comfortable with following up with cardiology outpatient.  We discussed return precautions in regards to his atrial fibrillation which he felt comfortable with.  At this time will discharge patient home with cardiology follow-up       ____________________________________________   FINAL CLINICAL IMPRESSION(S) / ED DIAGNOSES   Final diagnoses:  Atrial fibrillation, unspecified type ([place])      MEDICATIONS GIVEN DURING THIS VISIT:  Medications  metoprolol tartrate (LOPRESSOR) tablet 12.5 mg (12.5 mg Oral Given 07/22/20 1841)     ED Discharge Orders         Ordered    metoprolol  tartrate (LOPRESSOR) 25 MG tablet  2 times daily        07/22/20 1932           Note:  This document was prepared using Dragon voice recognition software and may include unintentional dictation errors.   Edward [place], MD 07/22/20 [phone]

## 2020-07-22 NOTE — ED Notes (Signed)
Patient ambulatory to restroom without difficulty. Primary RN notified. Remote provided to patient and wife.

## 2020-08-22 ENCOUNTER — Other Ambulatory Visit: Payer: Self-pay

## 2020-08-22 ENCOUNTER — Other Ambulatory Visit
Admission: RE | Admit: 2020-08-22 | Discharge: 2020-08-22 | Disposition: A | Discharge status: Home / Self Care | Payer: BC Managed Care – PPO | Source: Ambulatory Visit | Attending: Internal Medicine | Admitting: Internal Medicine

## 2020-08-22 DIAGNOSIS — Z20822 Contact with and (suspected) exposure to covid-19: Secondary | ICD-10-CM | POA: Insufficient documentation

## 2020-08-22 DIAGNOSIS — I480 Paroxysmal atrial fibrillation: Secondary | ICD-10-CM | POA: Diagnosis present

## 2020-08-22 DIAGNOSIS — Z01812 Encounter for preprocedural laboratory examination: Secondary | ICD-10-CM | POA: Insufficient documentation

## 2020-08-23 LAB — SARS CORONAVIRUS 2 (TAT 6-24 HRS): SARS Coronavirus 2: NEGATIVE

## 2020-08-24 ENCOUNTER — Encounter: Payer: Self-pay | Admitting: Internal Medicine

## 2020-08-24 ENCOUNTER — Ambulatory Visit: Payer: BC Managed Care – PPO | Admitting: Anesthesiology

## 2020-08-24 ENCOUNTER — Other Ambulatory Visit: Payer: Self-pay

## 2020-08-24 ENCOUNTER — Encounter
Admission: RE | Disposition: A | Discharge status: Home / Self Care | Payer: Self-pay | Source: Home / Self Care | Attending: Internal Medicine

## 2020-08-24 ENCOUNTER — Ambulatory Visit
Admission: RE | Admit: 2020-08-24 | Discharge: 2020-08-24 | Disposition: A | Discharge status: Home / Self Care | Payer: BC Managed Care – PPO | Attending: Internal Medicine | Admitting: Internal Medicine

## 2020-08-24 DIAGNOSIS — Z20822 Contact with and (suspected) exposure to covid-19: Secondary | ICD-10-CM | POA: Insufficient documentation

## 2020-08-24 DIAGNOSIS — I480 Paroxysmal atrial fibrillation: Secondary | ICD-10-CM | POA: Diagnosis not present

## 2020-08-24 HISTORY — DX: Unspecified atrial fibrillation: I48.91

## 2020-08-24 HISTORY — PX: CARDIOVERSION: SHX1299

## 2020-08-24 LAB — PROTIME-INR
INR: 1.1 (ref 0.8–1.2)
Prothrombin Time: 14.3 seconds (ref 11.4–15.2)

## 2020-08-24 SURGERY — CARDIOVERSION
Anesthesia: General

## 2020-08-24 MED ORDER — PROPOFOL 10 MG/ML IV BOLUS
INTRAVENOUS | Status: AC
  Filled 2020-08-24: qty 40

## 2020-08-24 MED ORDER — PROPOFOL 10 MG/ML IV BOLUS
INTRAVENOUS | Status: DC | PRN
  Administered 2020-08-24: 70 mg via INTRAVENOUS
  Administered 2020-08-24 (×2): 10 mg via INTRAVENOUS

## 2020-08-24 MED ORDER — SODIUM CHLORIDE 0.9 % IV SOLN: INTRAVENOUS | Status: DC | PRN

## 2020-08-24 NOTE — Transfer of Care (Signed)
Immediate Anesthesia Transfer of Care Note  Patient: Edward Lion Sr.  Procedure(s) Performed: CARDIOVERSION (N/A )  Patient Location: Cath Lab  Anesthesia Type:General  Level of Consciousness: drowsy and patient cooperative  Airway & Oxygen Therapy: Patient Spontanous Breathing and Patient connected to face mask oxygen  Post-op Assessment: Report given to RN and Post -op Vital signs reviewed and stable  Post vital signs: Reviewed and stable  Last Vitals:  Vitals Value Taken Time  BP 130/73 08/24/20 0730  Temp    Pulse 59 08/24/20 0737  Resp 17 08/24/20 0737  SpO2 99 % 08/24/20 0737  Vitals shown include unvalidated device data.  Last Pain:  Vitals:   08/24/20 0708  TempSrc: Oral  PainSc: 0-No pain         Complications: No complications documented.

## 2020-08-24 NOTE — Anesthesia Procedure Notes (Signed)
Procedure Name: General with mask airway Performed by: Fletcher-Harrison, Lavell Ridings, CRNA Pre-anesthesia Checklist: Patient identified, Emergency Drugs available, Suction available, Patient being monitored and Timeout performed Patient Re-evaluated:Patient Re-evaluated prior to induction Oxygen Delivery Method: Simple face mask Induction Type: IV induction Placement Confirmation: positive ETCO2 and CO2 detector Dental Injury: Teeth and Oropharynx as per pre-operative assessment        

## 2020-08-24 NOTE — CV Procedure (Signed)
Electrical Cardioversion Procedure Note Edward Juarez [id number] 11/29/1960  Procedure: Electrical Cardioversion Indications:  Paroxysmal non valvular atrial fibrillation  Procedure Details Consent: Risks of procedure as well as the alternatives and risks of each were explained to the (patient/caregiver).  Consent for procedure obtained. Time Out: Verified patient identification, verified procedure, site/side was marked, verified correct patient position, special equipment/implants available, medications/allergies/relevent history reviewed, required imaging and test results available.  Performed  Patient placed on cardiac monitor, pulse oximetry, supplemental oxygen as necessary.  Sedation given: Propofol and versed as per anesthesia  Pacer pads placed anterior and posterior chest.  Cardioverted 1 time(s).  Cardioverted at 120J.  Evaluation Findings: Post procedure EKG shows: NSR Complications: None Patient did tolerate procedure well.   Edward Juarez M.D. [facility] 08/24/2020, 7:34 AM

## 2020-08-24 NOTE — Discharge Instructions (Signed)
Moderate Conscious Sedation, Adult, Care After This sheet gives you information about how to care for yourself after your procedure. Your health care provider may also give you more specific instructions. If you have problems or questions, contact your health care provider. What can I expect after the procedure? After the procedure, it is common to have:  Sleepiness for several hours.  Impaired judgment for several hours.  Difficulty with balance.  Vomiting if you eat too soon. Follow these instructions at home: For the time period you were told by your health care provider:  Rest.  Do not participate in activities where you could fall or become injured.  Do not drive or use machinery.  Do not drink alcohol.  Do not take sleeping pills or medicines that cause drowsiness.  Do not make important decisions or sign legal documents.  Do not take care of children on your own.      Eating and drinking  Follow the diet recommended by your health care provider.  Drink enough fluid to keep your urine pale yellow.  If you vomit: ? Drink water, juice, or soup when you can drink without vomiting. ? Make sure you have little or no nausea before eating solid foods.   General instructions  Take over-the-counter and prescription medicines only as told by your health care provider.  Have a responsible adult stay with you for the time you are told. It is important to have someone help care for you until you are awake and alert.  Do not smoke.  Keep all follow-up visits as told by your health care provider. This is important. Contact a health care provider if:  You are still sleepy or having trouble with balance after 24 hours.  You feel light-headed.  You keep feeling nauseous or you keep vomiting.  You develop a rash.  You have a fever.  You have redness or swelling around the IV site. Get help right away if:  You have trouble breathing.  You have new-onset confusion at  home. Summary  After the procedure, it is common to feel sleepy, have impaired judgment, or feel nauseous if you eat too soon.  Rest after you get home. Know the things you should not do after the procedure.  Follow the diet recommended by your health care provider and drink enough fluid to keep your urine pale yellow.  Get help right away if you have trouble breathing or new-onset confusion at home. This information is not intended to replace advice given to you by your health care provider. Make sure you discuss any questions you have with your health care provider. Document Revised: 08/21/2019 Document Reviewed: 03/19/2019 Elsevier Patient Education  2021 [employer]. Hospital doctor cardioversion is the delivery of a jolt of electricity to restore a normal rhythm to the heart. A rhythm that is too fast or is not regular keeps the heart from pumping well. In this procedure, sticky patches or metal paddles are placed on the chest to deliver electricity to the heart from a device. This procedure may be done in an emergency if:  There is low or no blood pressure as a result of the heart rhythm.  Normal rhythm must be restored as fast as possible to protect the brain and heart from further damage.  It may save a life. This may also be a scheduled procedure for irregular or fast heart rhythms that are not immediately life-threatening. Tell a health care provider about:  Any allergies you have.  All medicines you are taking, including vitamins, herbs, eye drops, creams, and over-the-counter medicines.  Any problems you or family members have had with anesthetic medicines.  Any blood disorders you have.  Any surgeries you have had.  Any medical conditions you have.  Whether you are pregnant or may be pregnant. What are the risks? Generally, this is a safe procedure. However, problems may occur, including:  Allergic reactions to medicines.  A blood clot  that breaks free and travels to other parts of your body.  The possible return of an abnormal heart rhythm within hours or days after the procedure.  Your heart stopping (cardiac arrest). This is rare. What happens before the procedure? Medicines  Your health care provider may have you start taking: ? Blood-thinning medicines (anticoagulants) so your blood does not clot as easily. ? Medicines to help stabilize your heart rate and rhythm.  Ask your health care provider about: ? Changing or stopping your regular medicines. This is especially important if you are taking diabetes medicines or blood thinners. ? Taking medicines such as aspirin and ibuprofen. These medicines can thin your blood. Do not take these medicines unless your health care provider tells you to take them. ? Taking over-the-counter medicines, vitamins, herbs, and supplements. General instructions  Follow instructions from your health care provider about eating or drinking restrictions.  Plan to have someone take you home from the hospital or clinic.  If you will be going home right after the procedure, plan to have someone with you for 24 hours.  Ask your health care provider what steps will be taken to help prevent infection. These may include washing your skin with a germ-killing soap. What happens during the procedure?  An IV will be inserted into one of your veins.  Sticky patches (electrodes) or metal paddles may be placed on your chest.  You will be given a medicine to help you relax (sedative).  An electrical shock will be delivered. The procedure may vary among health care providers and hospitals.   What can I expect after the procedure?  Your blood pressure, heart rate, breathing rate, and blood oxygen level will be monitored until you leave the hospital or clinic.  Your heart rhythm will be watched to make sure it does not change.  You may have some redness on the skin where the shocks were  given. Follow these instructions at home:  Do not drive for 24 hours if you were given a sedative during your procedure.  Take over-the-counter and prescription medicines only as told by your health care provider.  Ask your health care provider how to check your pulse. Check it often.  Rest for 48 hours after the procedure or as told by your health care provider.  Avoid or limit your caffeine use as told by your health care provider.  Keep all follow-up visits as told by your health care provider. This is important. Contact a health care provider if:  You feel like your heart is beating too quickly or your pulse is not regular.  You have a serious muscle cramp that does not go away. Get help right away if:  You have discomfort in your chest.  You are dizzy or you feel faint.  You have trouble breathing or you are short of breath.  Your speech is slurred.  You have trouble moving an arm or leg on one side of your body.  Your fingers or toes turn cold or blue. Summary  Electrical cardioversion is  the delivery of a jolt of electricity to restore a normal rhythm to the heart.  This procedure may be done right away in an emergency or may be a scheduled procedure if the condition is not an emergency.  Generally, this is a safe procedure.  After the procedure, check your pulse often as told by your health care provider. This information is not intended to replace advice given to you by your health care provider. Make sure you discuss any questions you have with your health care provider. Document Revised: 11/24/2018 Document Reviewed: 11/24/2018 Elsevier Patient Education  [place].

## 2020-08-24 NOTE — Anesthesia Postprocedure Evaluation (Signed)
Anesthesia Post Note  Patient: Edward Lion Sr.  Procedure(s) Performed: CARDIOVERSION (N/A )  Patient location during evaluation: Specials Recovery Anesthesia Type: General Level of consciousness: awake and alert Pain management: pain level controlled Vital Signs Assessment: post-procedure vital signs reviewed and stable Respiratory status: spontaneous breathing, nonlabored ventilation, respiratory function stable and patient connected to nasal cannula oxygen Cardiovascular status: blood pressure returned to baseline and stable Postop Assessment: no apparent nausea or vomiting Anesthetic complications: no   No complications documented.   Last Vitals:  Vitals:   08/24/20 0732 08/24/20 0733  BP:  120/81  Pulse: (!) 59 66  Resp:  16  Temp:    SpO2: 100% 100%    Last Pain:  Vitals:   08/24/20 0733  TempSrc:   PainSc: Asleep                 Arita Miss

## 2020-08-24 NOTE — Anesthesia Preprocedure Evaluation (Signed)
Anesthesia Evaluation  Patient identified by MRN, date of birth, ID band Patient awake  General Assessment Comment: Last had pepsi cola at 4:30-5:00 AM  Reviewed: Allergy & Precautions, NPO status , Patient's Chart, lab work & pertinent test results  History of Anesthesia Complications Negative for: history of anesthetic complications  Airway Mallampati: II  TM Distance: >3 FB Neck ROM: Full    Dental no notable dental hx. (+) Teeth Intact   Pulmonary neg pulmonary ROS, neg sleep apnea, neg COPD, Patient abstained from smoking.Not current smoker, former smoker,    Pulmonary exam normal breath sounds clear to auscultation       Cardiovascular Exercise Tolerance: Good METS(-) hypertension(-) CAD and (-) Past MI + dysrhythmias Atrial Fibrillation  Rhythm:Irregular Rate:Normal - Systolic murmurs    Neuro/Psych Seizures -, Well Controlled,  negative psych ROS   GI/Hepatic neg GERD  ,(+)     (-) substance abuse  ,   Endo/Other  neg diabetes  Renal/GU negative Renal ROS     Musculoskeletal   Abdominal   Peds  Hematology   Anesthesia Other Findings Past Medical History: No date: Atrial fibrillation ([place]) No date: Seizures ([place])     Comment:  taken off meds in 2002  Reproductive/Obstetrics                             Anesthesia Physical Anesthesia Plan  ASA: II  Anesthesia Plan: General   Post-op Pain Management:    Induction: Intravenous  PONV Risk Score and Plan: 2 and Ondansetron, Propofol infusion and TIVA  Airway Management Planned: Nasal Cannula  Additional Equipment: None  Intra-op Plan:   Post-operative Plan:   Informed Consent: I have reviewed the patients History and Physical, chart, labs and discussed the procedure including the risks, benefits and alternatives for the proposed anesthesia with the patient or authorized representative who has indicated his/her  understanding and acceptance.     Dental advisory given  Plan Discussed with: CRNA and Surgeon  Anesthesia Plan Comments: (Discussed risks of anesthesia with patient, including possibility of difficulty with spontaneous ventilation under anesthesia necessitating airway intervention, PONV, and rare risks such as cardiac or respiratory or neurological events. Patient understands.)        Anesthesia Quick Evaluation

## 2022-03-15 IMAGING — CR DG CHEST 2V
2 series · 2 of 2 positions shown · non-contrast
Comparison: None.

CLINICAL DATA: Atrial fibrillation with RVR.

EXAM:
CHEST - 2 VIEW

[chest pa]
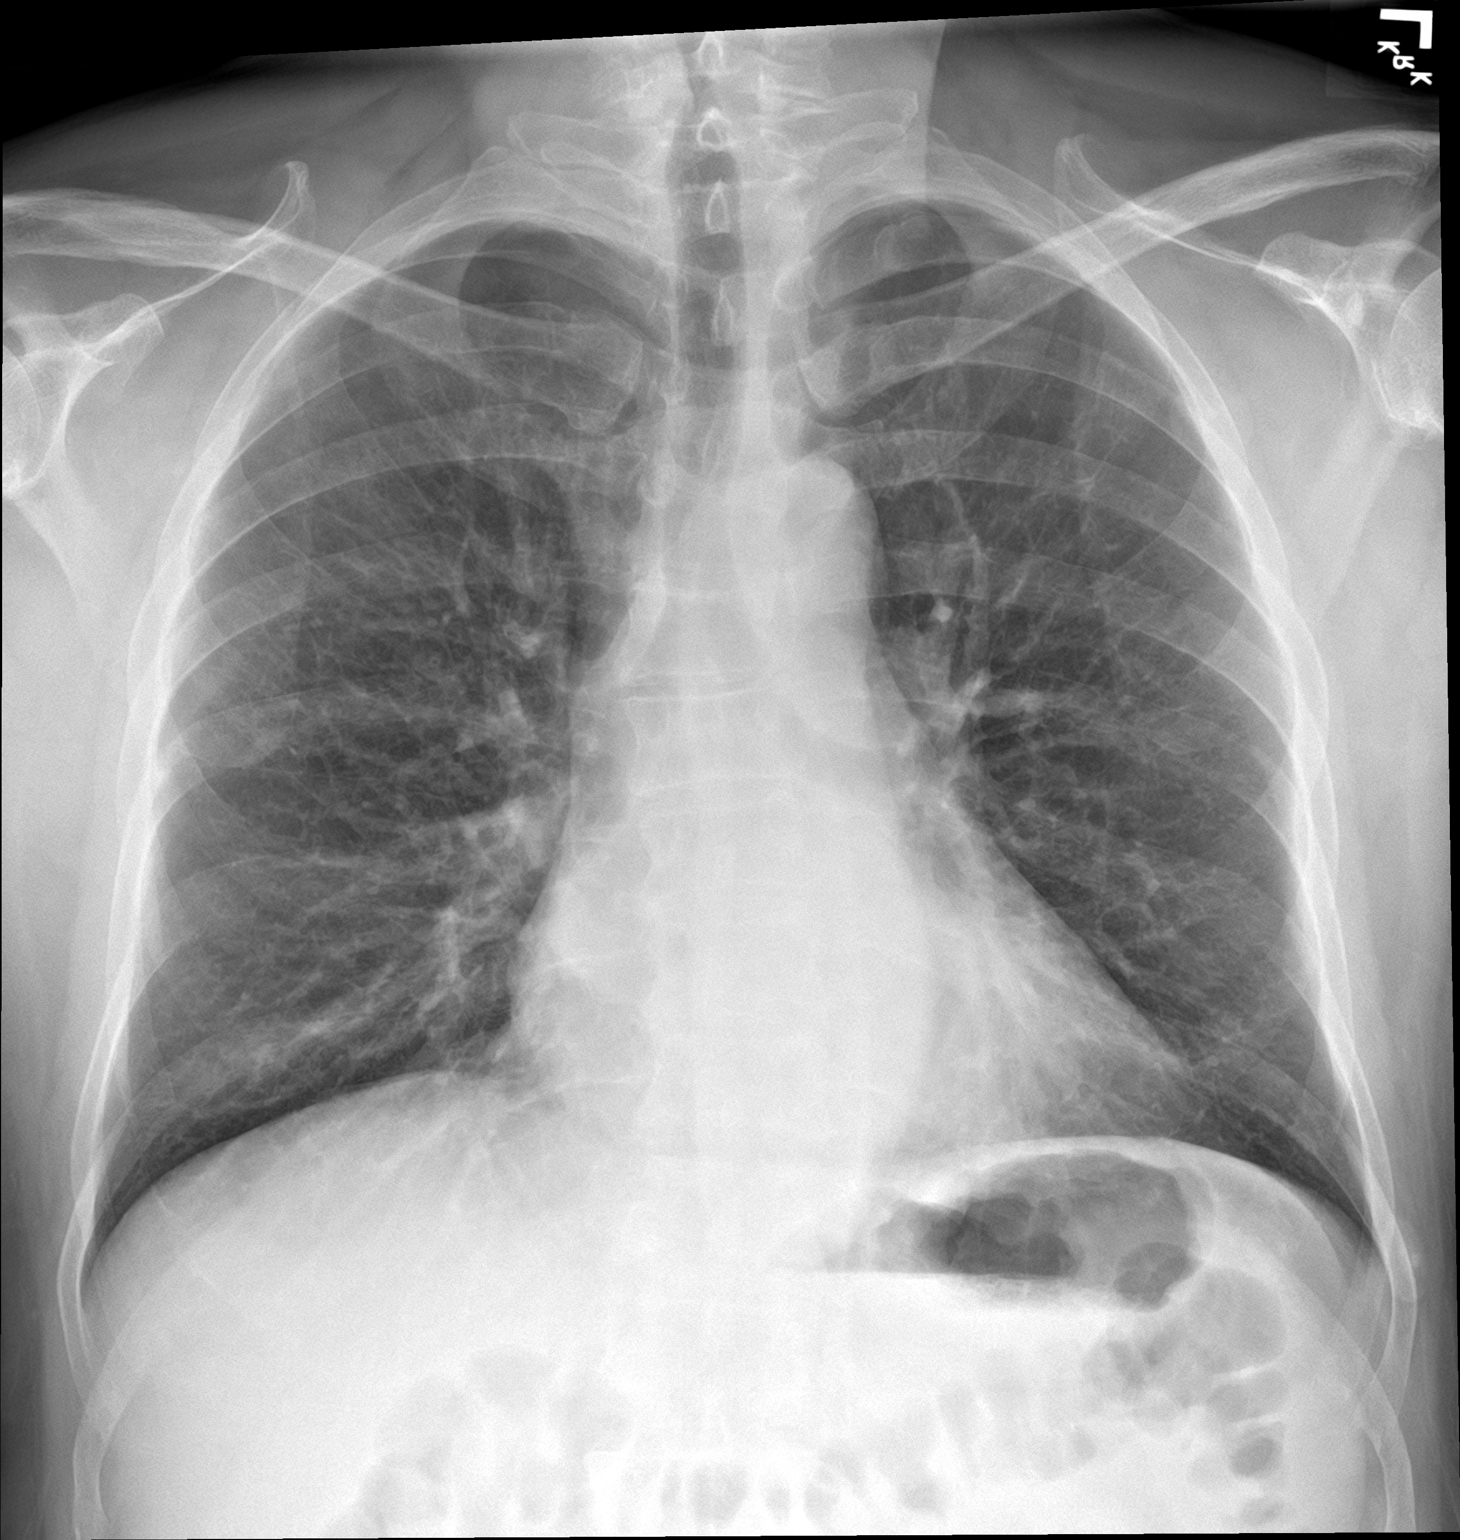

[chest lat]
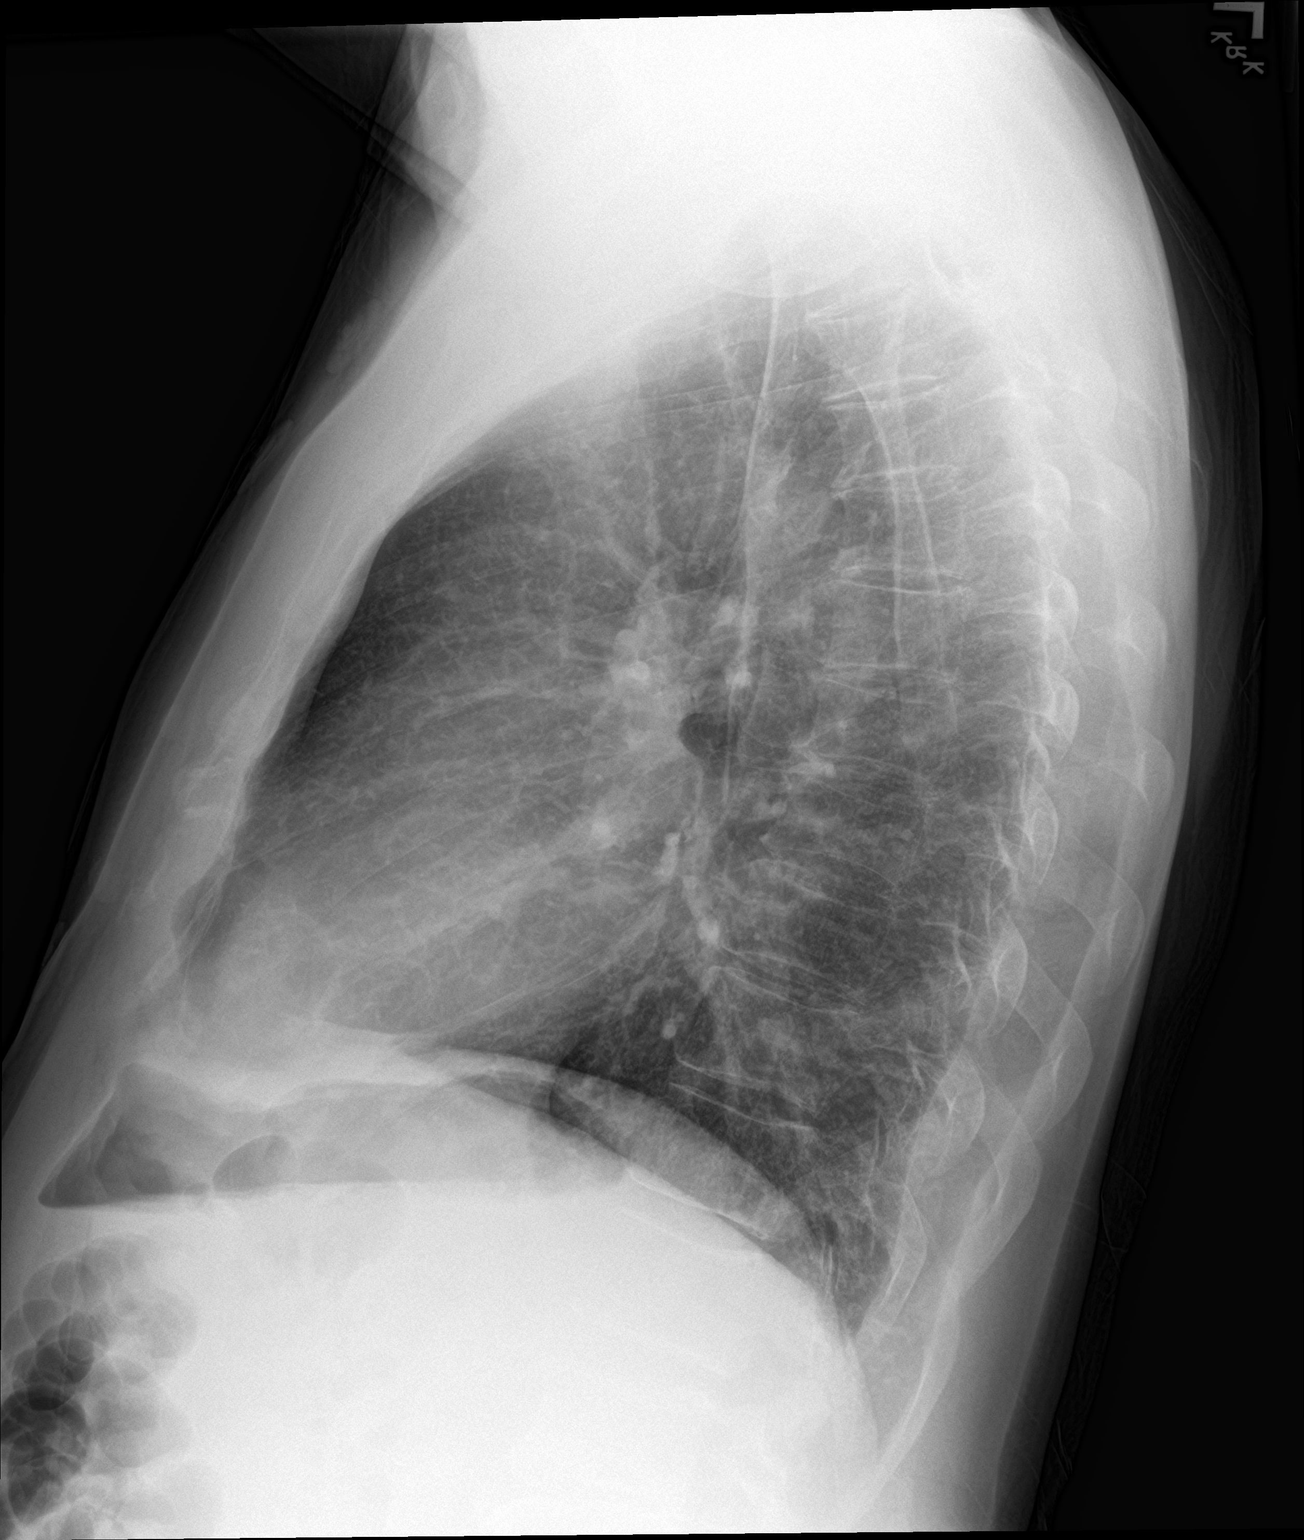

[2 of 2 positions shown; findings below may reference images not displayed]

FINDINGS: The cardiomediastinal contours are normal. Mild bronchial
thickening. Pulmonary vasculature is normal. No consolidation,
pleural effusion, or pneumothorax. No acute osseous abnormalities
are seen.
IMPRESSION: Mild bronchial thickening.
